# Patient Record
Sex: Female | Born: 1965 | Race: White | Hispanic: No | Marital: Single | Smoking: Former smoker
Health system: Southern US, Community
[De-identification: ages and names within clinical notes are randomized; demographics above are authoritative.]

## PROBLEM LIST (undated history)

## (undated) DIAGNOSIS — R002 Palpitations: Secondary | ICD-10-CM

## (undated) DIAGNOSIS — Z9109 Other allergy status, other than to drugs and biological substances: Secondary | ICD-10-CM

## (undated) DIAGNOSIS — J3089 Other allergic rhinitis: Secondary | ICD-10-CM

## (undated) DIAGNOSIS — E785 Hyperlipidemia, unspecified: Secondary | ICD-10-CM

## (undated) DIAGNOSIS — Z8619 Personal history of other infectious and parasitic diseases: Secondary | ICD-10-CM

## (undated) DIAGNOSIS — M1990 Unspecified osteoarthritis, unspecified site: Secondary | ICD-10-CM

## (undated) DIAGNOSIS — J302 Other seasonal allergic rhinitis: Secondary | ICD-10-CM

## (undated) DIAGNOSIS — G4733 Obstructive sleep apnea (adult) (pediatric): Secondary | ICD-10-CM

## (undated) HISTORY — DX: Other seasonal allergic rhinitis: J30.2

## (undated) HISTORY — DX: Palpitations: R00.2

## (undated) HISTORY — DX: Other allergic rhinitis: J30.89

---
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
## 2014-06-30 ENCOUNTER — Encounter: Payer: Self-pay | Admitting: Obstetrics & Gynecology

## 2017-04-26 ENCOUNTER — Institutional Professional Consult (permissible substitution): Payer: Self-pay | Admitting: Internal Medicine

## 2017-11-01 ENCOUNTER — Encounter: Payer: Self-pay | Admitting: Internal Medicine

## 2017-11-01 ENCOUNTER — Ambulatory Visit (INDEPENDENT_AMBULATORY_CARE_PROVIDER_SITE_OTHER): Payer: BLUE CROSS/BLUE SHIELD | Admitting: Internal Medicine

## 2017-11-01 VITALS — BP 126/80 | HR 69 | Ht 70.0 in | Wt 193.6 lb

## 2017-11-01 DIAGNOSIS — Z9109 Other allergy status, other than to drugs and biological substances: Secondary | ICD-10-CM | POA: Insufficient documentation

## 2017-11-01 DIAGNOSIS — G4733 Obstructive sleep apnea (adult) (pediatric): Secondary | ICD-10-CM

## 2017-11-01 DIAGNOSIS — J302 Other seasonal allergic rhinitis: Secondary | ICD-10-CM | POA: Diagnosis not present

## 2017-11-01 DIAGNOSIS — E785 Hyperlipidemia, unspecified: Secondary | ICD-10-CM | POA: Insufficient documentation

## 2017-11-01 DIAGNOSIS — J3089 Other allergic rhinitis: Secondary | ICD-10-CM

## 2017-11-01 DIAGNOSIS — M1990 Unspecified osteoarthritis, unspecified site: Secondary | ICD-10-CM

## 2017-11-01 DIAGNOSIS — Z8619 Personal history of other infectious and parasitic diseases: Secondary | ICD-10-CM

## 2017-11-01 HISTORY — DX: Hyperlipidemia, unspecified: E78.5

## 2017-11-01 HISTORY — DX: Other allergy status, other than to drugs and biological substances: Z91.09

## 2017-11-01 HISTORY — DX: Personal history of other infectious and parasitic diseases: Z86.19

## 2017-11-01 HISTORY — DX: Obstructive sleep apnea (adult) (pediatric): G47.33

## 2017-11-01 HISTORY — DX: Unspecified osteoarthritis, unspecified site: M19.90

## 2017-11-01 MED ORDER — AZELASTINE-FLUTICASONE 137-50 MCG/ACT NA SUSP: 2.0000 | Freq: Every day | NASAL | 0 refills | Status: DC

## 2017-11-01 NOTE — Assessment & Plan Note (Signed)
Bothersome nasal congestion and drainage somewhat improved by regular use of Flonase.  This problem may contribute to her loud snoring and sleep disordered breathing. Plan-try sample Dymista nasal spray

## 2017-11-01 NOTE — Patient Instructions (Signed)
Order- please schedule unattended home sleep test    Dx OSA  Please call me about 2 weeks after your sleep test, for results and recommendations. If appropriate, we may be able to start treatment before we see you next.  

## 2017-11-01 NOTE — Assessment & Plan Note (Signed)
Tentative diagnosis based on history.  We discussed the medical issues, basic sleep hygiene and treatment options.  She does not think she would tolerate CPAP, but might be a candidate for an oral appliance if needed. Plan-schedule sleep study.  She will call for results anticipating she might be a candidate for an oral appliance.

## 2017-11-01 NOTE — Progress Notes (Signed)
11/01/2017-52 year old female former smoker for sleep evaluation referred courtesy of Dr Seth BakeV. Juliene PinaMody. Sleep Consult snoring with whistling noises, people have told her that she snores, doesnt seem fully rested , she states she is a very light sleeper and probably could not wear CPAP. 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  Her impression is she snores loudly.  Sleep is disturbed by back pain, muscle cramps in the legs, numbness in the arms.  Slow to wake up to alertness in the morning.  Occasional nap on weekend is helpful. Drinks equivalent of 2 cups of caffeinated coffee daily.  Uses valerian root and occasionally melatonin to help sleep at night. Bothersome postnasal drip especially with seasonal allergic rhinitis she treats with Flonase each night.  No history of lung problems.  Evaluated in the past for irregular heartbeat.  Prior to Admission medications   Medication Sig Start Date End Date Taking? Authorizing Provider  cetirizine (ZYRTEC) 10 MG tablet Take 10 mg by mouth daily.   Yes [provider]  fluticasone (FLONASE) 50 MCG/ACT nasal spray Place into both nostrils daily.   Yes [provider]  Melatonin 3 MG CAPS Take by mouth.   Yes [provider]  naproxen sodium (ALEVE) 220 MG tablet Take 220 mg by mouth.   Yes [provider]  VALERIAN ROOT PO Take by mouth.   Yes [provider]  Azelastine-Fluticasone (DYMISTA) 137-50 MCG/ACT SUSP Place 2 sprays into both nostrils at bedtime. 11/01/17   Waymon BudgeYoung, Clinton D, MD   Past Medical History:  Diagnosis Date  . Heart palpitations   . Seasonal and perennial allergic rhinitis    History reviewed. No pertinent surgical history. Family History  Problem Relation Age of Onset  . Allergic rhinitis Mother   . Heart attack  Father    Social History   Socioeconomic History  . Marital status: Single    Spouse name: Not on file  . Number of children: Not on file  . Years of education: Not on file  . Highest education level: Not on file  Occupational History  . Not on file  Social Needs  . Financial resource strain: Not on file  . Food insecurity:    Worry: Not on file    Inability: Not on file  . Transportation needs:    Medical: Not on file    Non-medical: Not on file  Tobacco Use  . Smoking status: Former Smoker    Packs/day: 0.25    Years: 25.00    Pack years: 6.25    Types: Cigarettes    Last attempt to quit: 04/23/2008    Years since quitting: 9.5  . Smokeless tobacco: Never Used  Substance and Sexual Activity  . Alcohol use: Not on file  . Drug use: Not on file  . Sexual activity: Not on file  Lifestyle  . Physical activity:    Days per week: Not on file    Minutes per session: Not on file  . Stress: Not on file  Relationships  . Social connections:    Talks on phone: Not on file    Gets together: Not on file    Attends religious service: Not on file    Active member of club or organization: Not on file    Attends meetings of clubs or organizations: Not on file  Relationship status: Not on file  . Intimate partner violence:    Fear of current or ex partner: Not on file    Emotionally abused: Not on file    Physically abused: Not on file    Forced sexual activity: Not on file  Other Topics Concern  . Not on file  Social History Narrative  . Not on file   ROS-see HPI   + = positive Constitutional:    weight loss, night sweats, fevers, chills, +fatigue, lassitude. HEENT:    headaches, difficulty swallowing, tooth/dental problems, sore throat,       sneezing, itching, ear ache, +nasal congestion, post nasal drip, snoring CV:    chest pain, orthopnea, PND, swelling in lower extremities, anasarca,                                  dizziness, palpitations Resp:   shortness of  breath with exertion or at rest.                productive cough,   non-productive cough, coughing up of blood.              change in color of mucus.  wheezing.   Skin:    rash or lesions. GI:  No-   heartburn, indigestion, abdominal pain, nausea, vomiting, diarrhea,                 change in bowel habits, loss of appetite GU: dysuria, change in color of urine, no urgency or frequency.   flank pain. MS:   joint pain, stiffness, decreased range of motion, +back pain. +Leg cramps Neuro-     nothing unusual Psych:  change in mood or affect.  depression or anxiety.   memory loss.  OBJ- Physical Exam  + = positive General- Alert, Oriented, Affect-appropriate, Distress- none acute, medium build Skin- rash-none, lesions- none, excoriation- none Lymphadenopathy- none Head- atraumatic            Eyes- Gross vision intact, PERRLA, conjunctivae and secretions clear            Ears- Hearing, canals-normal            Nose- Clear, no-Septal dev, mucus, polyps, erosion, perforation             Throat- Mallampati II , mucosa clear , drainage- none, tonsils- atrophic, + own teeth Neck- flexible , trachea midline, no stridor , thyroid nl, carotid no bruit Chest - symmetrical excursion , unlabored           Heart/CV- RRR , no murmur , no gallop  , no rub, nl s1 s2                           - JVD- none , edema- none, stasis changes- none, varices- none           Lung- clear to P&A, wheeze- none, cough- none , dullness-none, rub- none           Chest wall-  Abd-  Br/ Gen/ Rectal- Not done, not indicated Extrem- cyanosis- none, clubbing, none, atrophy- none, strength- nl Neuro- grossly intact to observation

## 2017-11-08 ENCOUNTER — Telehealth: Payer: Self-pay | Admitting: Internal Medicine

## 2017-11-08 MED ORDER — AZELASTINE-FLUTICASONE 137-50 MCG/ACT NA SUSP: 2.0000 | Freq: Every day | NASAL | 2 refills | Status: DC

## 2017-11-08 NOTE — Telephone Encounter (Signed)
Called patient unable to reach left message to give us a call back. Patient was given sample on 7.12.19

## 2017-11-08 NOTE — Telephone Encounter (Signed)
Called and spoke with patient advised that medication has been sent to pharmacy. Nothing further needed.

## 2017-11-26 DIAGNOSIS — G4733 Obstructive sleep apnea (adult) (pediatric): Secondary | ICD-10-CM | POA: Diagnosis not present

## 2017-11-27 ENCOUNTER — Other Ambulatory Visit: Payer: Self-pay | Admitting: *Deleted

## 2017-11-27 DIAGNOSIS — G4733 Obstructive sleep apnea (adult) (pediatric): Secondary | ICD-10-CM

## 2017-12-18 ENCOUNTER — Telehealth: Payer: Self-pay | Admitting: Internal Medicine

## 2017-12-18 DIAGNOSIS — G4733 Obstructive sleep apnea (adult) (pediatric): Secondary | ICD-10-CM

## 2017-12-18 NOTE — Telephone Encounter (Signed)
Patient called, patient wanted results of Sleep Study. Informed patient the study has not resulted yet and we would call her once we had the results. Will route message to MD Young just as FYI. Nothing further needed at this time.

## 2017-12-24 NOTE — Addendum Note (Signed)
Addended by: Tana Felts on: 12/24/2017 10:17 AM   Modules accepted: Orders

## 2017-12-24 NOTE — Telephone Encounter (Signed)
Her home sleep test showed moderate obstructive sleep apnea, averaging 17 apneas/ hour. CPAP would usually be the preferred treatment for this, and she should keep an open mind about that option. However, she had said at her visit that she would probably feel more comfortable with an oral appliance.  Please refer her to orthodontist Dr Althea Grimmer for oral appliance to treat OSA.

## 2017-12-24 NOTE — Telephone Encounter (Signed)
Called and spoke to patient. Gave results of sleep study per Dr. Maple Hudson. Patient verbalized understanding and stated she would like to try to use the oral appliance.  Order placed for referral to Dr. Myrtis Ser per Dr. Maple Hudson.  Nothing further needed at this time.

## 2018-02-06 ENCOUNTER — Ambulatory Visit: Payer: BLUE CROSS/BLUE SHIELD | Admitting: Internal Medicine

## 2018-10-15 ENCOUNTER — Telehealth: Payer: Self-pay

## 2018-10-15 NOTE — Telephone Encounter (Signed)
NOTES ON FILE FROM [facility] [phone], REFERRAL SENT TO SCHEDULING

## 2018-11-19 ENCOUNTER — Telehealth: Payer: Self-pay | Admitting: *Deleted

## 2018-11-19 NOTE — Telephone Encounter (Signed)
Pt has answered NO to all covid19 prescreen questions below for appt 11/20/2018 with Dr. Radford Pax       COVID-19 Pre-Screening Questions:  . In the past 7 to 10 days have you had a cough,  shortness of breath, headache, congestion, fever (100 or greater) body aches, chills, sore throat, or sudden loss of taste or sense of smell? . Have you been around anyone with known Covid 19. . Have you been around anyone who is awaiting Covid 19 test results in the past 7 to 10 days? . Have you been around anyone who has been exposed to Covid 19, or has mentioned symptoms of Covid 19 within the past 7 to 10 days?  If you have any concerns/questions about symptoms patients report during screening (either on the phone or at threshold). Contact the provider seeing the patient or DOD for further guidance.  If neither are available contact a member of the leadership team.

## 2018-11-20 ENCOUNTER — Other Ambulatory Visit: Payer: Self-pay

## 2018-11-20 ENCOUNTER — Telehealth: Payer: Self-pay | Admitting: Radiology

## 2018-11-20 ENCOUNTER — Ambulatory Visit (INDEPENDENT_AMBULATORY_CARE_PROVIDER_SITE_OTHER): Payer: BLUE CROSS/BLUE SHIELD | Admitting: Cardiology

## 2018-11-20 ENCOUNTER — Encounter: Payer: Self-pay | Admitting: Cardiology

## 2018-11-20 VITALS — BP 117/80 | HR 82 | Ht 70.0 in | Wt 189.0 lb

## 2018-11-20 DIAGNOSIS — Z8249 Family history of ischemic heart disease and other diseases of the circulatory system: Secondary | ICD-10-CM | POA: Diagnosis not present

## 2018-11-20 DIAGNOSIS — R0602 Shortness of breath: Secondary | ICD-10-CM

## 2018-11-20 DIAGNOSIS — R002 Palpitations: Secondary | ICD-10-CM | POA: Insufficient documentation

## 2018-11-20 NOTE — Patient Instructions (Signed)
Medication Instructions:  Your physician recommends that you continue on your current medications as directed. Please refer to the Current Medication list given to you today.  Labwork: None ordered.  Testing/Procedures: Your physician has requested that you have Coronary Calcium CT. Cardiac computed tomography (CT) is a painless test that uses an x-ray machine to take clear, detailed pictures of your heart. For further information please visit HugeFiesta.tn. Please follow instruction sheet as given.  Your physician has requested that you have an echocardiogram. Echocardiography is a painless test that uses sound waves to create images of your heart. It provides your doctor with information about the size and shape of your heart and how well your heart's chambers and valves are working. This procedure takes approximately one hour. There are no restrictions for this procedure.  Your physician has recommended that you wear an event monitor. Event monitors are medical devices that record the heart's electrical activity. Doctors most often [place] these monitors to diagnose arrhythmias. Arrhythmias are problems with the speed or rhythm of the heartbeat. The monitor is a small, portable device. You can wear one while you do your normal daily activities. This is usually used to diagnose what is causing palpitations/syncope (passing out).   Follow-Up:  Your physician recommends that you schedule a follow-up appointment as needed with Dr Radford Pax.   Any Other Special Instructions Will Be Listed Below (If Applicable).     If you need a refill on your cardiac medications before your next appointment, please call your pharmacy.

## 2018-11-20 NOTE — Telephone Encounter (Signed)
Enrolled patient for a 30 Day Preventice Event monitor to be mailed. Brief instructions were gone over with the patient and she knows to expect the monitor to arrive in 3-4 days.  

## 2018-11-20 NOTE — Progress Notes (Signed)
Cardiology Office Note    Date:  11/20/2018   ID:  Nichole Nguyen, DOB 06/10/1965, MRN [id number]  PCP:  Deatra JamesSun, Vyvyan, MD  Cardiologist:  Armanda Magicraci Turner, MD   Chief Complaint  Patient presents with  . New Patient (Initial Visit)    palpitations, SOB    History of Present Illness:  Nichole Nguyen is a 53 y.o. female who is being seen today for the evaluation of palpitations at the request of Sun, Vyvyan, MD.  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  Recently she has been noticing palpitations.  She tells me that she has been noticing problems at night where all of a sudden she feels like she forgets to breath or has to remind herself to breath and then feels like her heart stops and she has to breath to jumpstart her heart.  She has not chest pain or pressure.  She denies any PND, orthopnea, LE edema or syncope.  She had one episode of dizziness recently after reaching up high to work on something and when she brought her hands down she got dizzy.  She says that she feels her heart beat is not right at times.  She has a hx of remote tobacco and quit 10 years ago.  Her father died of an MI at 53yo.    Past Medical History:  Diagnosis Date  . Arthritis 11/01/2017   osteoarthritis, RF- per patient  . Environmental allergies 11/01/2017  . Heart palpitations   . 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 Hyperlipidemia 11/01/2017   HDL of 60, LDL of 128 with high total of 203  . Obstructive sleep apnea 11/01/2017  . Seasonal and perennial allergic rhinitis     No past surgical history on file.  Current Medications: Current Meds  Medication Sig  . cetirizine (ZYRTEC) 10 MG tablet Take 5 mg by mouth as needed for allergies.   . fluticasone (FLONASE) 50 MCG/ACT nasal spray Place into both nostrils daily.  Marland Kitchen. ibuprofen (ADVIL) 200 MG tablet Take 200 mg by mouth as needed for pain.  . naproxen sodium (ALEVE) 220 MG tablet Take 220 mg by mouth daily as needed (pain).      Allergies:   Codeine   Social History   Socioeconomic History  . Marital status: Single    Spouse name: Not on file  . Number of children: Not on file  . Years of education: Not on file  . Highest education level: Not on file  Occupational History  . Not on file  Social Needs  . Financial resource strain: Not on file  . Food insecurity    Worry: Not on file    Inability: Not on file  . Transportation needs    Medical: Not on file    Non-medical: Not on file  Tobacco Use  . Smoking status: Former Smoker    Packs/day: 0.25    Years: 25.00    Pack years: 6.25    Types: Cigarettes    Quit date: 04/23/2008    Years since quitting: 10.5  . Smokeless tobacco: Never Used  Substance and Sexual Activity  . Alcohol use: Not on file  . Drug use: Not on file  . Sexual activity: Not on file  Lifestyle  . Physical activity    Days per week: Not on file    Minutes per session: Not on file  . Stress: Not on file  Relationships  . Social connections  Talks on phone: Not on file    Gets together: Not on file    Attends religious service: Not on file    Active member of club or organization: Not on file    Attends meetings of clubs or organizations: Not on file    Relationship status: Not on file  Other Topics Concern  . Not on file  Social History Narrative  . Not on file     Family History:  The patient's family history includes Allergic rhinitis in her mother; Heart attack in her father.   ROS:   Please see the history of present illness.    ROS All other systems reviewed and are negative.  No flowsheet data found.     PHYSICAL EXAM:   VS:  BP 117/80   Pulse 82   Ht 5\' 10"  (1.778 m)   Wt 189 lb (85.7 kg)   SpO2 98%   BMI 27.12 kg/m    GEN: Well nourished, well developed, in no acute distress  HEENT: normal  Neck: no JVD, carotid bruits, or masses Cardiac: RRR; no murmurs, rubs, or gallops,no edema.  Intact distal pulses bilaterally.  Respiratory:  clear to  auscultation bilaterally, normal work of breathing GI: soft, nontender, nondistended, + BS MS: no deformity or atrophy  Skin: warm and dry, no rash Neuro:  Alert and Oriented x 3, Strength and sensation are intact Psych: euthymic mood, full affect  Wt Readings from Last 3 Encounters:  11/20/18 189 lb (85.7 kg)  11/01/17 193 lb 9.6 oz (87.8 kg)      Studies/Labs Reviewed:   EKG:  EKG is not ordered today.    Recent Labs: No results found for requested labs within last 8760 hours.   Lipid Panel No results found for: CHOL, TRIG, HDL, CHOLHDL, VLDL, LDLCALC, LDLDIRECT  Additional studies/ records that were reviewed today include:  Office notes from PCP    ASSESSMENT:    1. Family history of premature CAD   2. SOB (shortness of breath)   3. Palpitations      PLAN:  In order of problems listed above:  1. Palpitations - her description is somewhat vague but it appears that she is having skipping of her heart beat which then feels like it takes her breath away.  There are no associated sx except that she feels SOB.  I will get a 30 day event monitor to assess for arrhythmias.   2.  SOB - difficult to discern whether this is from the palpitations.  I will get a 2D echo to assess LVF.  3.  Fx hx of premature CAD - her dad died of an MI at 53yo. She does have cardiac risk factors including remote tobacco use and fm hx of premature CAD.  I will get a coronary calcium score to assess for future risk.     Medication Adjustments/Labs and Tests Ordered: Current medicines are reviewed at length with the patient today.  Concerns regarding medicines are outlined above.  Medication changes, Labs and Tests ordered today are listed in the Patient Instructions below.  Patient Instructions  Medication Instructions:  Your physician recommends that you continue on your current medications as directed. Please refer to the Current Medication list given to you today.  Labwork: None ordered.   Testing/Procedures: Your physician has requested that you have Coronary Calcium CT. Cardiac computed tomography (CT) is a painless test that uses an x-ray machine to take clear, detailed pictures of your heart. For further information  please visit HugeFiesta.tn. Please follow instruction sheet as given.  Your physician has requested that you have an echocardiogram. Echocardiography is a painless test that uses sound waves to create images of your heart. It provides your doctor with information about the size and shape of your heart and how well your heart's chambers and valves are working. This procedure takes approximately one hour. There are no restrictions for this procedure.  Your physician has recommended that you wear an event monitor. Event monitors are medical devices that record the heart's electrical activity. Doctors most often [place] these monitors to diagnose arrhythmias. Arrhythmias are problems with the speed or rhythm of the heartbeat. The monitor is a small, portable device. You can wear one while you do your normal daily activities. This is usually used to diagnose what is causing palpitations/syncope (passing out).   Follow-Up:  Your physician recommends that you schedule a follow-up appointment as needed with Dr Radford Pax.   Any Other Special Instructions Will Be Listed Below (If Applicable).     If you need a refill on your cardiac medications before your next appointment, please call your pharmacy.      Signed, Fransico Him, MD  11/20/2018 9:07 AM    [facility] [address], [place], [place]  [address] Phone: [phone]; Fax: [phone]

## 2018-11-27 ENCOUNTER — Ambulatory Visit (INDEPENDENT_AMBULATORY_CARE_PROVIDER_SITE_OTHER): Payer: PRIVATE HEALTH INSURANCE

## 2018-11-27 DIAGNOSIS — R002 Palpitations: Secondary | ICD-10-CM | POA: Diagnosis not present

## 2018-12-02 ENCOUNTER — Other Ambulatory Visit: Payer: Self-pay

## 2018-12-02 ENCOUNTER — Ambulatory Visit (INDEPENDENT_AMBULATORY_CARE_PROVIDER_SITE_OTHER)
Admission: RE | Admit: 2018-12-02 | Discharge: 2018-12-02 | Disposition: A | Discharge status: Home / Self Care | Payer: Self-pay | Source: Ambulatory Visit | Attending: Cardiology | Admitting: Cardiology

## 2018-12-02 ENCOUNTER — Ambulatory Visit (HOSPITAL_COMMUNITY): Payer: PRIVATE HEALTH INSURANCE | Attending: Cardiology

## 2018-12-02 DIAGNOSIS — Z8249 Family history of ischemic heart disease and other diseases of the circulatory system: Secondary | ICD-10-CM

## 2018-12-02 DIAGNOSIS — R0602 Shortness of breath: Secondary | ICD-10-CM | POA: Insufficient documentation

## 2018-12-03 ENCOUNTER — Telehealth: Payer: Self-pay | Admitting: Cardiology

## 2018-12-03 NOTE — Telephone Encounter (Signed)
Pt has been notified of echo results by phone with verbal understanding. Pt thanked me for the call. The patient has been notified of the result and verbalized understanding.  All questions (if any) were answered. Nichole Nguyen, [place] 12/03/2018 9:40 AM

## 2018-12-03 NOTE — Telephone Encounter (Signed)
Follow Up:     Pt is returning a cal from yesterday, concerning her Echo results.

## 2019-01-13 ENCOUNTER — Telehealth: Payer: Self-pay

## 2019-01-13 ENCOUNTER — Other Ambulatory Visit: Payer: Self-pay

## 2019-01-13 DIAGNOSIS — I499 Cardiac arrhythmia, unspecified: Secondary | ICD-10-CM

## 2019-01-13 NOTE — Telephone Encounter (Signed)
-----   Message from Sueanne Margarita, MD sent at 01/13/2019 12:30 PM EDT ----- Heart monitor showed occasional extra heart beats from top  And bottom of heart which are benign.  Pelase have her come in for BMET and TSH.  If these are normal then would not recommend any further treatment except for avoiding caffeine and ETOh.  If they become very bothersom could try medication

## 2019-01-13 NOTE — Telephone Encounter (Signed)
Notes recorded by Frederik Schmidt, RN on 01/13/2019 at 12:36 PM EDT  The patient has been notified of the result and verbalized understanding. All questions (if any) were answered.  Frederik Schmidt, RN 01/13/2019 12:36 PM

## 2019-01-15 ENCOUNTER — Other Ambulatory Visit: Payer: Self-pay

## 2019-01-15 ENCOUNTER — Other Ambulatory Visit: Payer: PRIVATE HEALTH INSURANCE | Admitting: *Deleted

## 2019-01-15 DIAGNOSIS — I499 Cardiac arrhythmia, unspecified: Secondary | ICD-10-CM

## 2019-01-15 LAB — BASIC METABOLIC PANEL
BUN/Creatinine Ratio: 15 (ref 9–23)
BUN: 13 mg/dL (ref 6–24)
CO2: 24 mmol/L (ref 20–29)
Calcium: 9.8 mg/dL (ref 8.7–10.2)
Chloride: 103 mmol/L (ref 96–106)
Creatinine, Ser: 0.84 mg/dL (ref 0.57–1.00)
GFR calc Af Amer: 92 mL/min/{1.73_m2} (ref >59)
GFR calc non Af Amer: 80 mL/min/{1.73_m2} (ref >59)
Glucose: 93 mg/dL (ref 65–99)
Potassium: 4 mmol/L (ref 3.5–5.2)
Sodium: 140 mmol/L (ref 134–144)

## 2019-01-15 LAB — TSH: TSH: 2.55 u[IU]/mL (ref 0.450–4.500)

## 2020-07-08 IMAGING — CT CT HEART SCORING
2 series · 16 of 20 positions shown, 18 images · non-contrast
Comparison: None.

Addendum:
EXAM:
OVER-READ INTERPRETATION  CT CHEST

The following report is an over-read performed by radiologist Dr.
Jellali Ansi [[email or web address]] on 12/02/2018. This
over-read does not include interpretation of cardiac or coronary
anatomy or pathology. The
CLINICAL DATA: Risk stratification
Coronary Calcium Score
TECHNIQUE: The patient was scanned on a Siemens Force scanner. Axial
non-contrast 3 mm slices were carried out through the heart. The
data set was analyzed on a dedicated work station and scored using
the Agatson method.

[Series 2: casc 3.0 i36f 2 bestdiast 67 % · axial · 0.36mm/px · z∈[-275,-149]mm · 8 of 56 slices shown, 10 images]
[im 7/56  vessel]
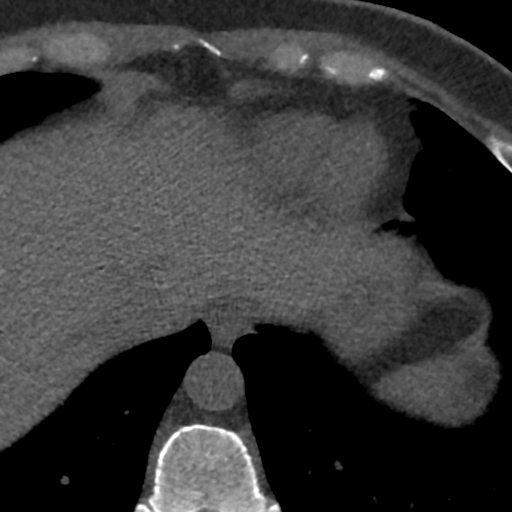
[im 7/56  lung]
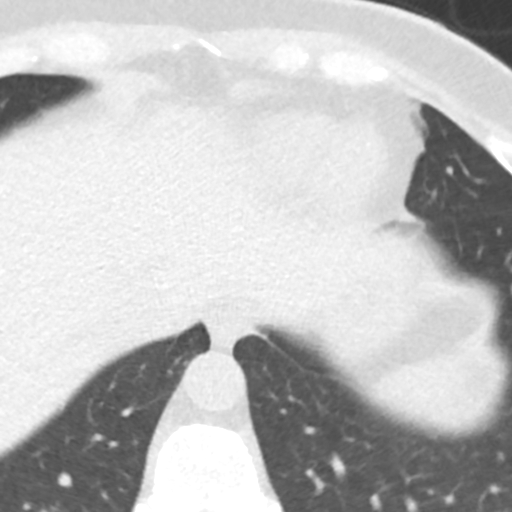
[im 13/56  vessel]
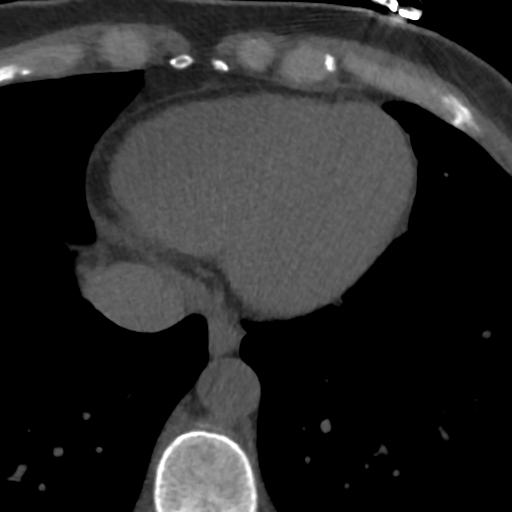
[im 19/56  vessel]
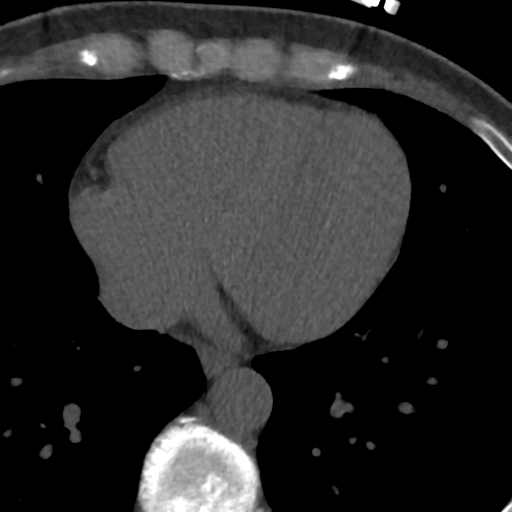
[im 25/56  vessel]
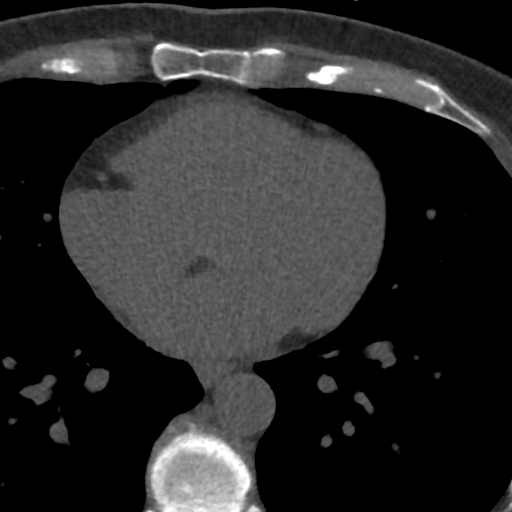
[im 31/56  vessel]
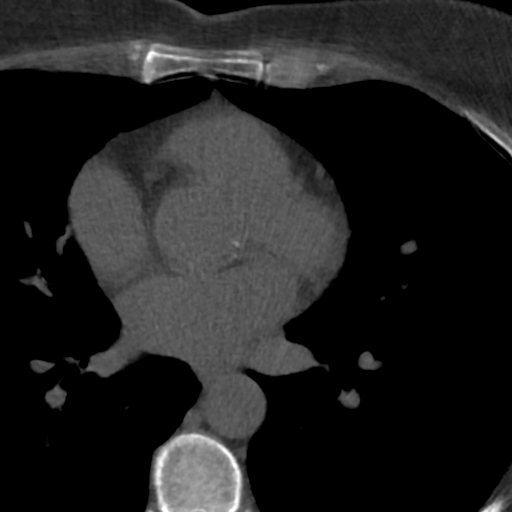
[im 31/56  lung]
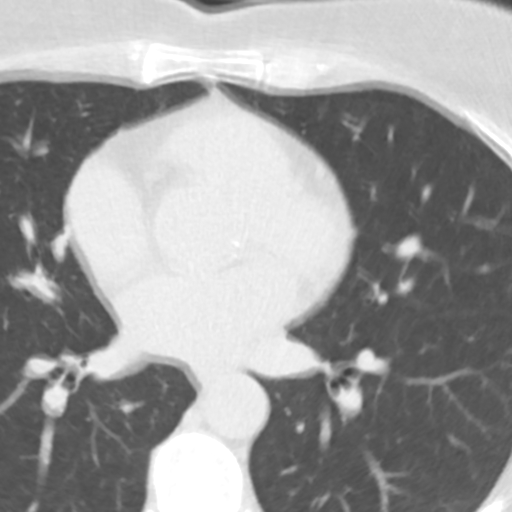
[im 37/56  vessel]
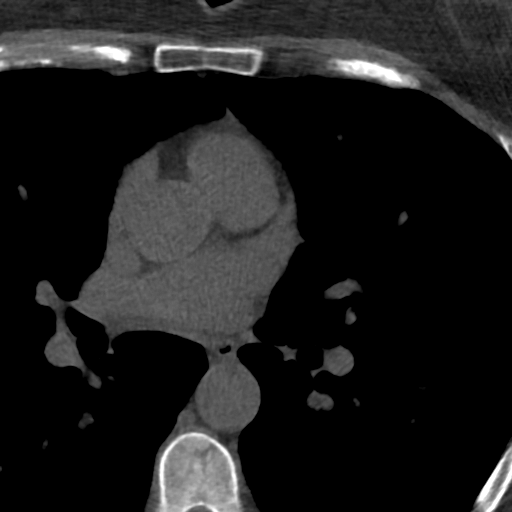
[im 43/56  vessel]
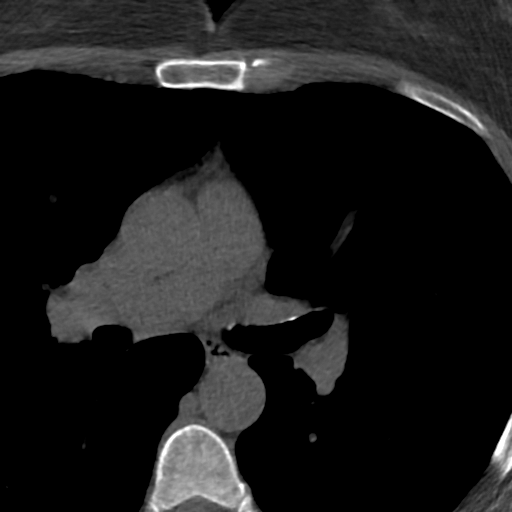
[im 49/56  vessel]
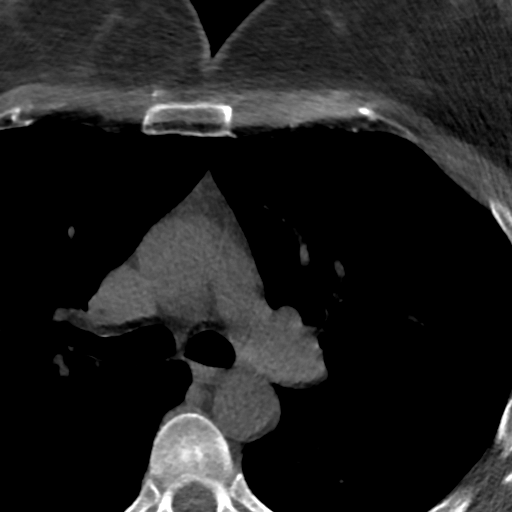

[Series 4: lung st 68 % · axial · 0.68mm/px · z∈[-275,-149]mm · 8 of 56 slices shown]
[im 7/56  lung]
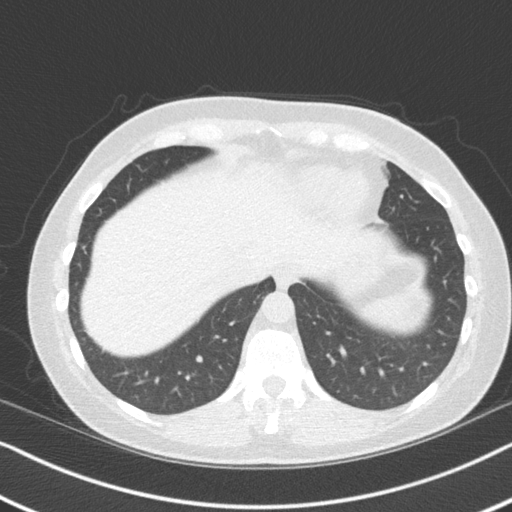
[im 13/56  lung]
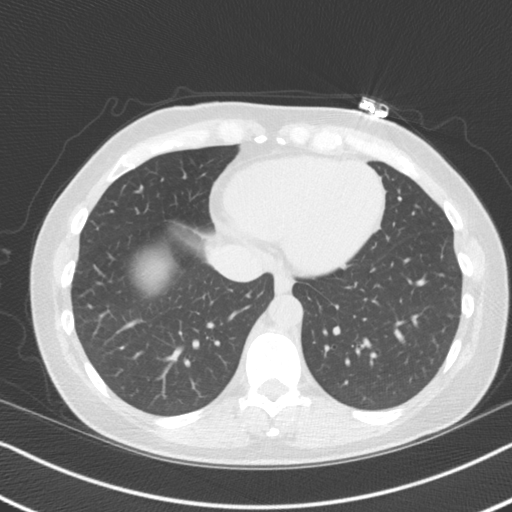
[im 19/56  lung]
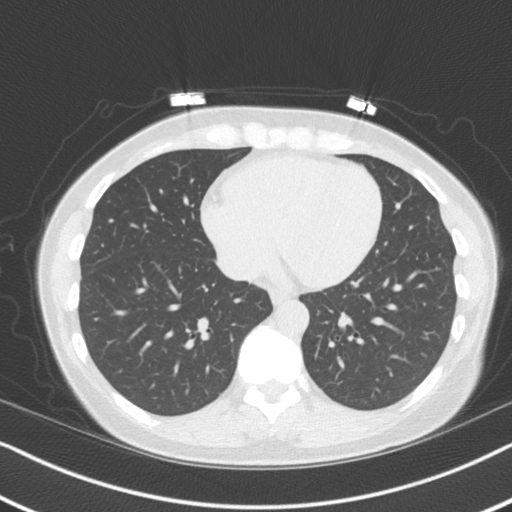
[im 25/56  lung]
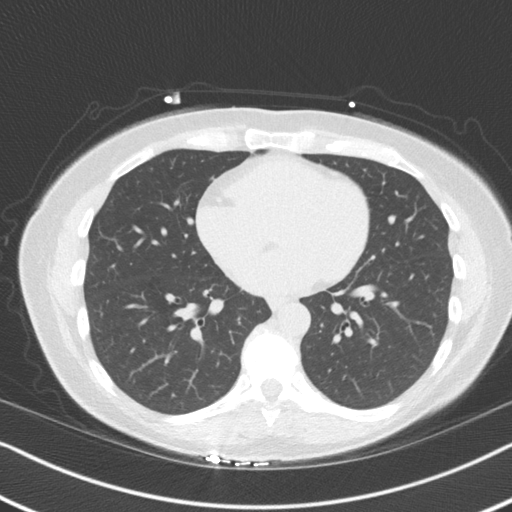
[im 31/56  lung]
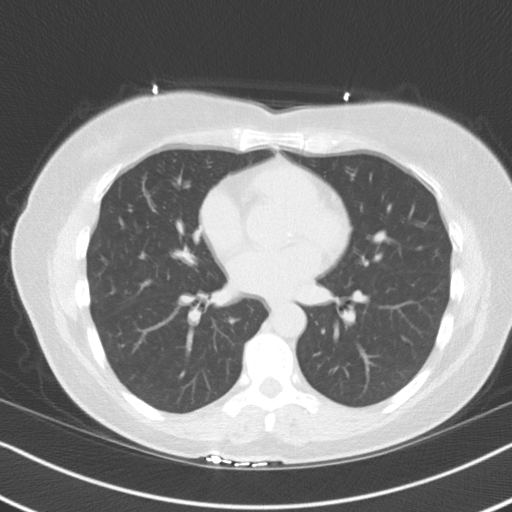
[im 37/56  lung]
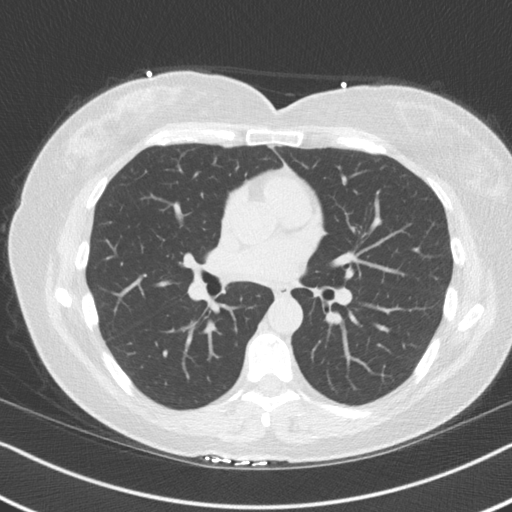
[im 43/56  lung]
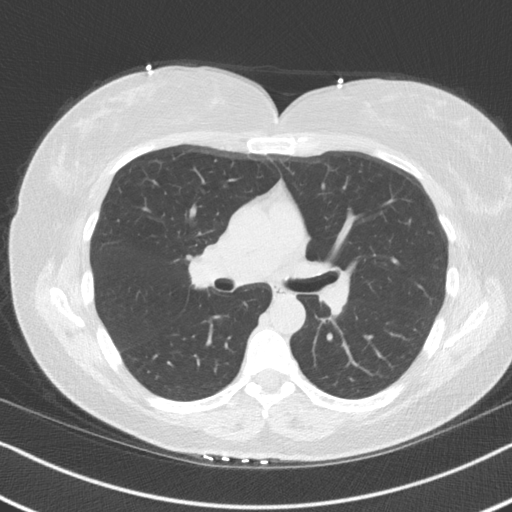
[im 49/56  lung]
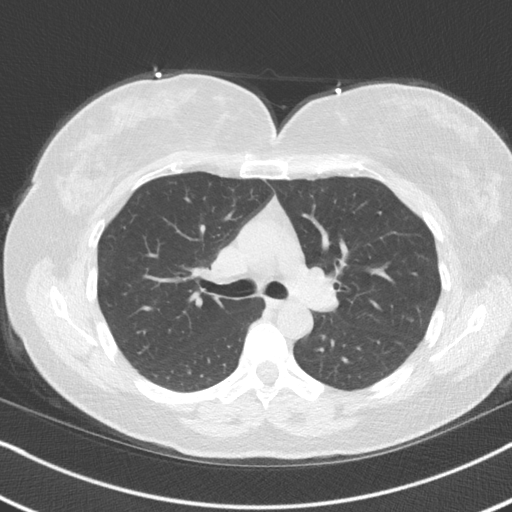

[16 of 20 positions shown; findings below may reference images not displayed]

FINDINGS: Non-cardiac: See separate report from [REDACTED].

Ascending Aorta: Normal Caliber. Mild aortic annular calcifications.

Pericardium: Normal

Coronary arteries: Normal coronary origins.
IMPRESSION: Coronary calcium score of 0. This was 0 percentile for age and sex
matched control.

Jellali Ansi

EXAM:
OVER-READ INTERPRETATION  CT CHEST

The following report is an over-read performed by radiologist Dr.
over-read does not include interpretation of cardiac or coronary
anatomy or pathology. The calcium score interpretation by the
cardiologist is attached.
FINDINGS: Limited view of the lung parenchyma demonstrates no suspicious
nodularity. Airways are normal.

Limited view of the mediastinum demonstrates no adenopathy.
Esophagus normal.

Limited view of the upper abdomen unremarkable.

Limited view of the skeleton and chest wall is unremarkable.
IMPRESSION: No significant extracardiac findings

*** End of Addendum ***
EXAM:
OVER-READ INTERPRETATION  CT CHEST

The following report is an over-read performed by radiologist Dr.
Jellali Ansi [REDACTED] on 12/02/2018. This
over-read does not include interpretation of cardiac or coronary
anatomy or pathology. The
FINDINGS: Non-cardiac: See separate report from [REDACTED].

Ascending Aorta: Normal Caliber. Mild aortic annular calcifications.

Pericardium: Normal

Coronary arteries: Normal coronary origins.
IMPRESSION: Coronary calcium score of 0. This was 0 percentile for age and sex
matched control.

Jellali Ansi
